# Patient Record
Sex: Male | Born: 1990 | Race: White | Hispanic: No | Marital: Single | Smoking: Current every day smoker
Health system: Southern US, Community
[De-identification: ages and names within clinical notes are randomized; demographics above are authoritative.]

## PROBLEM LIST (undated history)

## (undated) DIAGNOSIS — M519 Unspecified thoracic, thoracolumbar and lumbosacral intervertebral disc disorder: Secondary | ICD-10-CM

## (undated) DIAGNOSIS — K2970 Gastritis, unspecified, without bleeding: Secondary | ICD-10-CM

## (undated) DIAGNOSIS — B9681 Helicobacter pylori [H. pylori] as the cause of diseases classified elsewhere: Secondary | ICD-10-CM

## (undated) DIAGNOSIS — K529 Noninfective gastroenteritis and colitis, unspecified: Secondary | ICD-10-CM

## (undated) DIAGNOSIS — K2990 Gastroduodenitis, unspecified, without bleeding: Secondary | ICD-10-CM

---
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
## 2012-08-26 ENCOUNTER — Ambulatory Visit (INDEPENDENT_AMBULATORY_CARE_PROVIDER_SITE_OTHER): Payer: 59 | Admitting: Family Medicine

## 2012-08-26 ENCOUNTER — Encounter: Payer: Self-pay | Admitting: Family Medicine

## 2012-08-26 VITALS — BP 109/74 | HR 67 | Temp 98.0°F | Ht 66.5 in | Wt 141.2 lb

## 2012-08-26 DIAGNOSIS — Z720 Tobacco use: Secondary | ICD-10-CM

## 2012-08-26 DIAGNOSIS — Z0000 Encounter for general adult medical examination without abnormal findings: Secondary | ICD-10-CM

## 2012-08-26 LAB — POCT CBC
Granulocyte percent: 52 %G (ref 37–80)
HCT, POC: 44.1 % (ref 43.5–53.7)
Hemoglobin: 15.6 g/dL (ref 14.1–18.1)
Lymph, poc: 2.1 (ref 0.6–3.4)
MCH, POC: 30.7 pg (ref 27–31.2)
MCHC: 35.5 g/dL — AB (ref 31.8–35.4)
MCV: 86.5 fL (ref 80–97)
MPV: 7.3 fL (ref 0–99.8)
POC Granulocyte: 2.7 (ref 2–6.9)
POC LYMPH PERCENT: 40.9 %L (ref 10–50)
Platelet Count, POC: 228 10*3/uL (ref 142–424)
RBC: 5.1 M/uL (ref 4.69–6.13)
RDW, POC: 13.6 %
WBC: 5.2 10*3/uL (ref 4.6–10.2)

## 2012-08-26 LAB — LIPID PANEL
Cholesterol: 134 mg/dL (ref 0–200)
HDL: 31 mg/dL — ABNORMAL LOW (ref >39)
LDL Cholesterol: 84 mg/dL (ref 0–99)
Total CHOL/HDL Ratio: 4.3 Ratio
Triglycerides: 97 mg/dL (ref <150)
VLDL: 19 mg/dL (ref 0–40)

## 2012-08-26 LAB — TSH: TSH: 1.602 u[IU]/mL (ref 0.350–4.500)

## 2012-08-26 NOTE — Progress Notes (Signed)
  Subjective:    Patient ID: Mario Moon, male    DOB: Apr 18, 1990, 22 y.o.   MRN: [id number]  HPI This 22 y.o. male presents for evaluation of complete physical exam.  He is a smoker  and smokes 1/2 ppd.  He states he is trying to quit smoking.  He states he drinks sodas About 5 or 6 a day.  He denies ETOH use.  He denies any other health concerns.  He  Is divorced and has a fiance and is planning on getting married.     Review of Systems No chest pain, SOB, HA, dizziness, vision change, N/V, diarrhea, constipation, dysuria, urinary urgency or frequency, myalgias, arthralgias or rash.     Objective:   Physical Exam Vital signs noted  Well developed well nourished male.  HEENT - Head atraumatic Normocephalic                Eyes - PERRLA, Conjuctiva - clear Sclera- Clear EOMI                Ears - EAC's Wnl TM's Wnl Gross Hearing WNL                Nose - Nares patent                 Throat - oropharanx wnl Respiratory - Lungs CTA bilateral Cardiac - RRR S1 and S2 without murmur GI - Abdomen soft Nontender and bowel sounds active x 4 Extremities - No edema. Neuro - Grossly intact.       Assessment & Plan:  Routine general medical examination at a health care facility - Plan: POCT CBC, TSH, Lipid panel, COMPLETE METABOLIC PANEL WITH GFR

## 2012-08-27 LAB — COMPLETE METABOLIC PANEL WITH GFR
ALT: 16 U/L (ref 0–53)
AST: 14 U/L (ref 0–37)
Albumin: 4.3 g/dL (ref 3.5–5.2)
Alkaline Phosphatase: 44 U/L (ref 39–117)
BUN: 19 mg/dL (ref 6–23)
CO2: 25 mEq/L (ref 19–32)
Calcium: 9.2 mg/dL (ref 8.4–10.5)
Chloride: 101 mEq/L (ref 96–112)
Creat: 0.89 mg/dL (ref 0.50–1.35)
GFR, Est African American: >89 mL/min
GFR, Est Non African American: >89 mL/min
Glucose, Bld: 76 mg/dL (ref 70–99)
Potassium: 4.3 mEq/L (ref 3.5–5.3)
Sodium: 137 mEq/L (ref 135–145)
Total Bilirubin: 0.3 mg/dL (ref 0.3–1.2)
Total Protein: 6.8 g/dL (ref 6.0–8.3)

## 2012-08-27 NOTE — Patient Instructions (Signed)
Smoking Cessation Quitting smoking is important to your health and has many advantages. However, it is not always easy to quit since nicotine is a very addictive drug. Often times, people try 3 times or more before being able to quit. This document explains the best ways for you to prepare to quit smoking. Quitting takes hard work and a lot of effort, but you can do it. ADVANTAGES OF QUITTING SMOKING  You will live longer, feel better, and live better.  Your body will feel the impact of quitting smoking almost immediately.  Within 20 minutes, blood pressure decreases. Your pulse returns to its normal level.  After 8 hours, carbon monoxide levels in the blood return to normal. Your oxygen level increases.  After 24 hours, the chance of having a heart attack starts to decrease. Your breath, hair, and body stop smelling like smoke.  After 48 hours, damaged nerve endings begin to recover. Your sense of taste and smell improve.  After 72 hours, the body is virtually free of nicotine. Your bronchial tubes relax and breathing becomes easier.  After 2 to 12 weeks, lungs can hold more air. Exercise becomes easier and circulation improves.  The risk of having a heart attack, stroke, cancer, or lung disease is greatly reduced.  After 1 year, the risk of coronary heart disease is cut in half.  After 5 years, the risk of stroke falls to the same as a nonsmoker.  After 10 years, the risk of lung cancer is cut in half and the risk of other cancers decreases significantly.  After 15 years, the risk of coronary heart disease drops, usually to the level of a nonsmoker.  If you are pregnant, quitting smoking will improve your chances of having a healthy baby.  The people you live with, especially any children, will be healthier.  You will have extra money to spend on things other than cigarettes. QUESTIONS TO THINK ABOUT BEFORE ATTEMPTING TO QUIT You may want to talk about your answers with your  caregiver.  Why do you want to quit?  If you tried to quit in the past, what helped and what did not?  What will be the most difficult situations for you after you quit? How will you plan to handle them?  Who can help you through the tough times? Your family? Friends? A caregiver?  What pleasures do you get from smoking? What ways can you still get pleasure if you quit? Here are some questions to ask your caregiver:  How can you help me to be successful at quitting?  What medicine do you think would be best for me and how should I take it?  What should I do if I need more help?  What is smoking withdrawal like? How can I get information on withdrawal? GET READY  Set a quit date.  Change your environment by getting rid of all cigarettes, ashtrays, matches, and lighters in your home, car, or work. Do not let people smoke in your home.  Review your past attempts to quit. Think about what worked and what did not. GET SUPPORT AND ENCOURAGEMENT You have a better chance of being successful if you have help. You can get support in many ways.  Tell your family, friends, and co-workers that you are going to quit and need their support. Ask them not to smoke around you.  Get individual, group, or telephone counseling and support. Programs are available at local hospitals and health centers. Call your local health department for   information about programs in your area.  Spiritual beliefs and practices may help some smokers quit.  Download a "quit meter" on your computer to keep track of quit statistics, such as how long you have gone without smoking, cigarettes not smoked, and money saved.  Get a self-help book about quitting smoking and staying off of tobacco. LEARN NEW SKILLS AND BEHAVIORS  Distract yourself from urges to smoke. Talk to someone, go for a walk, or occupy your time with a task.  Change your normal routine. Take a different route to work. Drink tea instead of coffee.  Eat breakfast in a different place.  Reduce your stress. Take a hot bath, exercise, or read a book.  Plan something enjoyable to do every day. Reward yourself for not smoking.  Explore interactive web-based programs that specialize in helping you quit. GET MEDICINE AND USE IT CORRECTLY Medicines can help you stop smoking and decrease the urge to smoke. Combining medicine with the above behavioral methods and support can greatly increase your chances of successfully quitting smoking.  Nicotine replacement therapy helps deliver nicotine to your body without the negative effects and risks of smoking. Nicotine replacement therapy includes nicotine gum, lozenges, inhalers, nasal sprays, and skin patches. Some may be available over-the-counter and others require a prescription.  Antidepressant medicine helps people abstain from smoking, but how this works is unknown. This medicine is available by prescription.  Nicotinic receptor partial agonist medicine simulates the effect of nicotine in your brain. This medicine is available by prescription. Ask your caregiver for advice about which medicines to use and how to use them based on your health history. Your caregiver will tell you what side effects to look out for if you choose to be on a medicine or therapy. Carefully read the information on the package. Do not use any other product containing nicotine while using a nicotine replacement product.  RELAPSE OR DIFFICULT SITUATIONS Most relapses occur within the first 3 months after quitting. Do not be discouraged if you start smoking again. Remember, most people try several times before finally quitting. You may have symptoms of withdrawal because your body is used to nicotine. You may crave cigarettes, be irritable, feel very hungry, cough often, get headaches, or have difficulty concentrating. The withdrawal symptoms are only temporary. They are strongest when you first quit, but they will go away within  10 14 days. To reduce the chances of relapse, try to:  Avoid drinking alcohol. Drinking lowers your chances of successfully quitting.  Reduce the amount of caffeine you consume. Once you quit smoking, the amount of caffeine in your body increases and can give you symptoms, such as a rapid heartbeat, sweating, and anxiety.  Avoid smokers because they can make you want to smoke.  Do not let weight gain distract you. Many smokers will gain weight when they quit, usually less than 10 pounds. Eat a healthy diet and stay active. You can always lose the weight gained after you quit.  Find ways to improve your mood other than smoking. FOR MORE INFORMATION  www.smokefree.gov  Document Released: 01/22/2001 Document Revised: 07/30/2011 Document Reviewed: 05/09/2011 [employer] Patient Information 2014 [employer].  

## 2014-07-15 ENCOUNTER — Emergency Department (HOSPITAL_BASED_OUTPATIENT_CLINIC_OR_DEPARTMENT_OTHER)
Admission: EM | Admit: 2014-07-15 | Discharge: 2014-07-15 | Disposition: A | Discharge status: Home / Self Care | Payer: Self-pay | Attending: Emergency Medicine | Admitting: Emergency Medicine

## 2014-07-15 ENCOUNTER — Encounter (HOSPITAL_BASED_OUTPATIENT_CLINIC_OR_DEPARTMENT_OTHER): Payer: Self-pay | Admitting: *Deleted

## 2014-07-15 DIAGNOSIS — K029 Dental caries, unspecified: Secondary | ICD-10-CM | POA: Insufficient documentation

## 2014-07-15 DIAGNOSIS — Z720 Tobacco use: Secondary | ICD-10-CM | POA: Insufficient documentation

## 2014-07-15 DIAGNOSIS — K002 Abnormalities of size and form of teeth: Secondary | ICD-10-CM | POA: Insufficient documentation

## 2014-07-15 MED ORDER — TRAMADOL HCL 50 MG PO TABS: 50.0000 mg | ORAL_TABLET | Freq: Four times a day (QID) | ORAL | Status: AC | PRN

## 2014-07-15 MED ORDER — PENICILLIN V POTASSIUM 500 MG PO TABS: 500.0000 mg | ORAL_TABLET | Freq: Four times a day (QID) | ORAL | Status: AC

## 2014-07-15 MED ORDER — IBUPROFEN 800 MG PO TABS: 800.0000 mg | ORAL_TABLET | Freq: Three times a day (TID) | ORAL | Status: AC | PRN

## 2014-07-15 NOTE — ED Notes (Signed)
Dental pain. 

## 2014-07-15 NOTE — ED Provider Notes (Signed)
CSN: [id number]     Arrival date & time 07/15/14  1809 History   First MD Initiated Contact with Patient 07/15/14 1818     Chief Complaint  Patient presents with  . Dental Pain     (Consider location/radiation/quality/duration/timing/severity/associated sxs/prior Treatment) HPI Patient presents to the emergency department with dental pain that started several days ago.  The patient states that he has chronic dental pain due to severely decaying teeth.  Patient states he has not had any nausea, vomiting, weakness, dizziness, headache, blurred vision, fever or syncope.  The patient states that he did not take any medications prior to arrival History reviewed. No pertinent past medical history. History reviewed. No pertinent past surgical history. No family history on file. History  Substance Use Topics  . Smoking status: Current Every Day Smoker -- 0.50 packs/day    Types: Cigarettes  . Smokeless tobacco: Not on file  . Alcohol Use: No    Review of Systems All other systems negative except as documented in the HPI. All pertinent positives and negatives as reviewed in the HPI.   Allergies  Hydrocodone  Home Medications   Prior to Admission medications   Medication Sig Start Date End Date Taking? Authorizing Provider  traMADol (ULTRAM) 50 MG tablet Take 50 mg by mouth every 6 (six) hours as needed for pain.    Historical Provider, MD   BP 121/66 mmHg  Pulse 86  Temp(Src) 98.9 F (37.2 C) (Oral)  Resp 18  Ht 5\' 7"  (1.702 m)  Wt 150 lb (68.04 kg)  BMI 23.49 kg/m2  SpO2 99% Physical Exam  Constitutional: He is oriented to person, place, and time. He appears well-developed and well-nourished. No distress.  HENT:  Head: Normocephalic and atraumatic.  Mouth/Throat: Uvula is midline and oropharynx is clear and moist. No oral lesions. No trismus in the jaw. Abnormal dentition. Dental caries present. No dental abscesses or uvula swelling.    Eyes: Pupils are equal, round, and  reactive to light.  Neck: Normal range of motion. Neck supple.  Cardiovascular: Normal rate, regular rhythm and normal heart sounds.  Exam reveals no gallop and no friction rub.   No murmur heard. Pulmonary/Chest: Effort normal and breath sounds normal. No respiratory distress.  Neurological: He is alert and oriented to person, place, and time. He exhibits normal muscle tone. Coordination normal.  Skin: Skin is warm and dry. No rash noted. No erythema.  Nursing note and vitals reviewed.   ED Course  Procedures (including critical care time) He is given dental follow-up.  Told to return here as needed.  Patient agrees the plan and all questions were answered   Charlestine NightChristopher Aleshka Corney, PA-C 07/19/14 2355  Geoffery Lyonsouglas Delo, MD 07/20/14 [phone]

## 2014-07-15 NOTE — Discharge Instructions (Signed)
Return here as needed.  Follow-up with the oral surgeon provided.  Rinse with warm water, peroxide 3 times a day

## 2017-12-12 ENCOUNTER — Encounter: Attending: Physician Assistant | Primary: Physician Assistant

## 2017-12-22 ENCOUNTER — Ambulatory Visit
Admit: 2017-12-22 | Discharge: 2017-12-22 | Payer: PRIVATE HEALTH INSURANCE | Attending: Physician Assistant | Primary: Physician Assistant

## 2017-12-22 DIAGNOSIS — G43809 Other migraine, not intractable, without status migrainosus: Secondary | ICD-10-CM

## 2017-12-22 MED ORDER — TIZANIDINE HCL 4 MG PO TABS
4 MG | ORAL_TABLET | Freq: Three times a day (TID) | ORAL | 2 refills | Status: DC | PRN
Start: 2017-12-22 — End: 2018-02-26

## 2017-12-22 MED ORDER — IBUPROFEN 800 MG PO TABS
800 MG | ORAL_TABLET | Freq: Four times a day (QID) | ORAL | 2 refills | Status: DC | PRN
Start: 2017-12-22 — End: 2018-08-11

## 2017-12-22 NOTE — Progress Notes (Signed)
[facility]  [address]  [place], [place] [address]   [phone]      Todd Reyes  1990-11-23  12/22/17      HPI:  Patient presents for establishment with a longstanding history of migraine, anxiety, adhd and lumbar back pain. He is going to a chiropractor who has done 2 mris, showing lumbar disc disease. He has no radicular sx right now. He has had pain radiating to his right posterior leg in the past. His chiropractor suggests that he start PT. He takes motrin 800 mg for this and it does help. He rates his pain 3/10 right now, and feels mostly "tightness." he has not been on a muscle relaxer. He has seen psychiatry for a dx of ADHD and anxiety since childhood. He has also smoked since childhood. He is not ready to quit.     History reviewed. No pertinent past medical history.     History reviewed. No pertinent surgical history.    Current Outpatient Medications   Medication Sig Dispense Refill   . doxepin (SINEQUAN) 100 MG capsule Take 100 mg by mouth nightly     . amphetamine-dextroamphetamine (ADDERALL XR) 15 MG extended release capsule Take 15 mg by mouth 2 times daily.     Marland Kitchen tiZANidine (ZANAFLEX) 4 MG tablet Take 1 tablet by mouth 3 times daily as needed (muscle spasm) 30 tablet 2   . ibuprofen (ADVIL;MOTRIN) 800 MG tablet Take 1 tablet by mouth every 6 hours as needed for Pain 120 tablet 2     No current facility-administered medications for this visit.        No Known Allergies    History reviewed. No pertinent family history.    Social History     Socioeconomic History   . Marital status: Divorced     Spouse name: Not on file   . Number of children: Not on file   . Years of education: Not on file   . Highest education level: Not on file   Occupational History   . Not on file   Social Needs   . Financial resource strain: Not on file   . Food insecurity:     Worry: Not on file     Inability: Not on file   . Transportation needs:     Medical: Not on file     Non-medical: Not on file   Tobacco  Use   . Smoking status: Current Every Day Smoker     Packs/day: 0.50     Years: 23.00     Pack years: 11.50     Types: Cigarettes   . Smokeless tobacco: Former Estate agent and Sexual Activity   . Alcohol use: Not Currently   . Drug use: Not Currently   . Sexual activity: Yes     Partners: Female   Lifestyle   . Physical activity:     Days per week: Not on file     Minutes per session: Not on file   . Stress: Not on file   Relationships   . Social connections:     Talks on phone: Not on file     Gets together: Not on file     Attends religious service: Not on file     Active member of club or organization: Not on file     Attends meetings of clubs or organizations: Not on file     Relationship status: Not on file   . Intimate partner  violence:     Fear of current or ex partner: Not on file     Emotionally abused: Not on file     Physically abused: Not on file     Forced sexual activity: Not on file   Other Topics Concern   . Not on file   Social History Narrative   . Not on file       Review of Systems :  Constitutional: negative for - chills, fever, weight loss, or fatigue  Psychological: negative for - anxiety, depression or suicidal ideation  HEENT: negative for - vision changes, nasal congestion, ear pain or pharyngitis   Respiratory: negative for -  Chest heaviness, cough, shortness of breath, or pleuritic pain  Cardiovascular: negative for - diaphoresis, chest pain, palpitations, or edema   Gastrointestinal: negative for -abdominal pain, change in bowel habits, constipation, diarrhea, or nausea/vomiting  Genito-Urinary: negative for - dysuria, frequency, or nocturia  Neurological: negative for - bowel and bladder control changes, dizziness, or headaches   Dermatological: negative for - dry skin, rash, hair or nail symptoms    Physical Exam:   Vitals:    12/22/17 0814   BP: 118/62   Site: Right Upper Arm   Position: Sitting   Cuff Size: Large Adult   Pulse: 95   Resp: 20   Temp: 97.8 F (36.6 C)   TempSrc:  Oral   SpO2: 99%   Weight: 160 lb (72.6 kg)   Height: 5\' 6"  (1.676 m)       Physical Exam  Constitutional:       General: He is not in acute distress.     Appearance: He is well-developed.   HENT:      Head: Normocephalic and atraumatic.      Right Ear: External ear normal.      Left Ear: External ear normal.      Nose: Nose normal.   Eyes:      General: No scleral icterus.     Conjunctiva/sclera: Conjunctivae normal.      Pupils: Pupils are equal, round, and reactive to light.   Neck:      Musculoskeletal: Normal range of motion and neck supple.      Thyroid: No thyromegaly.   Cardiovascular:      Rate and Rhythm: Normal rate and regular rhythm.      Heart sounds: Normal heart sounds. No murmur.   Pulmonary:      Effort: Pulmonary effort is normal. No accessory muscle usage or respiratory distress.      Breath sounds: Normal breath sounds. No wheezing.   Musculoskeletal: Normal range of motion.         General: Tenderness (left low lumbar spasm) present.   Skin:     General: Skin is warm and dry.      Findings: No rash.   Neurological:      Mental Status: He is alert and oriented to person, place, and time.      Deep Tendon Reflexes: Reflexes are normal and symmetric.   Psychiatric:         Speech: Speech normal.         Behavior: Behavior normal.         Assessment/Plan:     Todd Reyes was seen today for establish care, back pain and migraine.    Diagnoses and all orders for this visit:    Other migraine without status migrainosus, not intractable    Anxiety    Smoker  Attention deficit hyperactivity disorder (ADHD), unspecified ADHD type    Lumbar disc disease  -     Amb External Referral To Physical Therapy  -     tiZANidine (ZANAFLEX) 4 MG tablet; Take 1 tablet by mouth 3 times daily as needed (muscle spasm)  -     ibuprofen (ADVIL;MOTRIN) 800 MG tablet; Take 1 tablet by mouth every 6 hours as needed for Pain        Return in about 6 weeks (around 02/02/2018). signed record release for chiropractor and previous  PCP.  Refused Fluzone.     Florinda Marker, PA

## 2018-01-19 DIAGNOSIS — Z202 Contact with and (suspected) exposure to infections with a predominantly sexual mode of transmission: Secondary | ICD-10-CM

## 2018-01-19 NOTE — ED Notes (Signed)
Discharge instructions given. Patient verbalizes understanding. No other noted or stated problems at this time. Patient will follow up with primary care.     Lavetta Nielsenarissa M Joangel Vanosdol, RN  01/19/18 2230

## 2018-01-19 NOTE — ED Provider Notes (Signed)
[facility]  ??  Department of Emergency Medicine   ED  Provider Note  Admit Date/RoomTime: 01/19/2018  9:29 PM  ED Room: 35/35  Chief Complaint   Exposure to STD    History of Present Illness   Source of history provided by:  patient.  History/Exam Limitations: none.     Todd Reyes is a 27 y.o. old male who has a past medical history of: No past medical history on file. presents to the emergency department by private vehicle, for known exposure to STD with dysuria, which occured 1 week(s) prior to arrival.  Pt states his girlfriend test + for trichomonas and it was recommended he was treated. All of her other results were negative per girlfriend. Since exposure/onset the symptoms have been intermittent and mild in severity. He denies any genital discharge, genital rash, genital ulcers, scrotal mass or scrotal pain. His prior STD history: none. Denies cp, sob, abdominal pain, nvd, melena, bloody stool.     ROS    Pertinent positives and negatives are stated within HPI, all other systems reviewed and are negative.    Past Surgical History:  has no past surgical history on file.  Social History:  reports that he has been smoking cigarettes. He has a 11.50 pack-year smoking history. He has quit using smokeless tobacco. He reports previous alcohol use. He reports previous drug use.  Family History: family history is not on file.  Allergies: Patient has no known allergies.    Physical Exam         ED Triage Vitals   BP Temp Temp src Pulse Resp SpO2 Height Weight   -- -- -- -- -- -- -- --      Oxygen Saturation Interpretation: Normal.    ?? Constitutional:  Alert, development consistent with age.  ?? HEENT:  NC/NT.  Airway patent  ?? Neck:  Normal ROM.  Supple.  ?? Respiratory:  Lungs Clear to auscultation and breath sounds equal.  ?? CV:  Regular rate and rhythm, normal heart sounds, without pathological murmurs, ectopy, gallops, or rubs.  ?? GI:  Abdomen Soft, nontender, good bowel sounds.  No firm or pulsatile  mass.  ?? GU:          Penis: .            Scrotum: normal.           Testicle: normal without masses bilateral.  ?? Integument:  Normal turgor.  Warm, dry, without visible rash, unless noted elsewhere.  Lymphatics: No lymphangitis or adenopathy noted.  ?? Neurological:  Orientation age-appropriate.  Motor functions intact.    Lab / Imaging Results   (All laboratory and radiology results have been personally reviewed by myself)  Labs:  Results for orders placed or performed during the hospital encounter of 01/19/18   C.trachomatis N.gonorrhoeae DNA, Urine   Result Value Ref Range    Source Urine      Imaging:  All Radiology results interpreted by Radiologist unless otherwise noted.  No orders to display     ED Course / Medical Decision Making     Medications   azithromycin (ZITHROMAX) tablet 1,000 mg (has no administration in time range)   cefTRIAXone (ROCEPHIN) injection 250 mg (has no administration in time range)   metroNIDAZOLE (FLAGYL) tablet 2,000 mg (has no administration in time range)   lidocaine PF 1 % injection (has no administration in time range)        Consult(s):   None  Procedure(s):   none    Medical Decision Making: Patient exposed to trichomonas per partner. Patient to check my chart for lab results, and he is agreeable with this plan. Pt treated for STI exposure- counseled on abstaining from intercourse for 1 week. Pt educated on safe sex practices. Pt to f/u with PCP for further evaluation. Pt to return to ED with new or worsening symptoms.     Counseling:    The emergency provider has spoken with the patient and discussed today???s results, in addition to providing specific details for the plan of care and counseling regarding the diagnosis and prognosis.  Questions are answered at this time and they are agreeable with the plan.    Assessment     1. Concern about STD in male without diagnosis      Plan   Discharge to home  Patient condition is stable    New Medications     New Prescriptions    No  medications on file     Electronically signed by Burnett Kanaris, PA-C   DD: 01/19/18  **This report was transcribed using voice recognition software. Every effort was made to ensure accuracy; however, inadvertent computerized transcription errors may be present.  END OF ED PROVIDER NOTE     Burnett Kanaris, PA-C  01/19/18 2205

## 2018-01-20 ENCOUNTER — Inpatient Hospital Stay
Admit: 2018-01-20 | Discharge: 2018-01-20 | Disposition: A | Discharge status: Home / Self Care | Payer: PRIVATE HEALTH INSURANCE

## 2018-01-20 LAB — URINALYSIS
Bilirubin Urine: NEGATIVE
Blood, Urine: NEGATIVE
Glucose, Ur: NEGATIVE mg/dL
Ketones, Urine: NEGATIVE mg/dL
Leukocyte Esterase, Urine: NEGATIVE
Nitrite, Urine: NEGATIVE
Protein, UA: NEGATIVE mg/dL
Specific Gravity, UA: 1.025 (ref 1.005–1.030)
Urobilinogen, Urine: 1 E.U./dL (ref <2.0)
pH, UA: 7 (ref 5.0–9.0)

## 2018-01-20 MED ORDER — LIDOCAINE HCL (PF) 1 % IJ SOLN
1 % | INTRAMUSCULAR | Status: AC
Start: 2018-01-20 — End: 2018-01-19
  Administered 2018-01-20: 03:00:00 2

## 2018-01-20 MED ORDER — AZITHROMYCIN 250 MG PO TABS
250 MG | Freq: Once | ORAL | Status: AC
Start: 2018-01-20 — End: 2018-01-19
  Administered 2018-01-20: 03:00:00 1000 mg via ORAL

## 2018-01-20 MED ORDER — METRONIDAZOLE 500 MG PO TABS
500 MG | Freq: Once | ORAL | Status: AC
Start: 2018-01-20 — End: 2018-01-19
  Administered 2018-01-20: 03:00:00 2000 mg via ORAL

## 2018-01-20 MED ORDER — CEFTRIAXONE SODIUM 250 MG IJ SOLR
250 MG | Freq: Once | INTRAMUSCULAR | Status: AC
Start: 2018-01-20 — End: 2018-01-19
  Administered 2018-01-20: 03:00:00 250 mg via INTRAMUSCULAR

## 2018-01-20 MED FILL — CEFTRIAXONE SODIUM 250 MG IJ SOLR: 250 mg | INTRAMUSCULAR | Qty: 250

## 2018-01-20 MED FILL — METRONIDAZOLE 500 MG PO TABS: 500 mg | ORAL | Qty: 4

## 2018-01-20 MED FILL — LIDOCAINE HCL (PF) 1 % IJ SOLN: 1 % | INTRAMUSCULAR | Qty: 2

## 2018-01-20 MED FILL — AZITHROMYCIN 250 MG PO TABS: 250 mg | ORAL | Qty: 4

## 2018-01-23 LAB — C.TRACHOMATIS N.GONORRHOEAE DNA, URINE
C. trachomatis DNA ,Urine: NEGATIVE
N. gonorrhoeae DNA, Urine: NEGATIVE

## 2018-02-09 ENCOUNTER — Encounter: Payer: PRIVATE HEALTH INSURANCE | Attending: Physician Assistant | Primary: Physician Assistant

## 2018-02-26 ENCOUNTER — Ambulatory Visit
Admit: 2018-02-26 | Discharge: 2018-02-26 | Payer: PRIVATE HEALTH INSURANCE | Attending: Surgery | Primary: Physician Assistant

## 2018-02-26 DIAGNOSIS — K922 Gastrointestinal hemorrhage, unspecified: Secondary | ICD-10-CM

## 2018-02-26 MED ORDER — BISACODYL 5 MG PO TBEC
5 MG | ORAL_TABLET | ORAL | 0 refills | Status: DC
Start: 2018-02-26 — End: 2018-03-16

## 2018-02-26 MED ORDER — MAGNESIUM CITRATE PO SOLN
Freq: Three times a day (TID) | ORAL | 0 refills | Status: DC
Start: 2018-02-26 — End: 2018-03-16

## 2018-02-26 NOTE — Progress Notes (Signed)
Scheduled pt for Colonoscopy on 03/16/18 at 9:00 AM. Pt needs to arrive at [facility] : [facility]. [place] at 8:00 AM. Sent instruction sheet.  Electronically signed by Barbaraann Faster on 02/26/18 at 4:21 PM
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
## 2018-02-26 NOTE — Patient Instructions (Signed)
Dr. Tawanna Funk recommended colonoscopy with possible biopsy or polypectomy and he explained the risk, benefits, expected outcome, and alternatives to the procedure.  Risks included but are not limited to bleeding, infection, respiratory distress, hypotension, and perforation of the colon.  You understood and were in agreement. You will need to have someone bring you to the hospital and take you home because you will not be able to drive or work the rest of that day.  Also, you need to have someone stay with you the rest of the day to make sure you do not develop any complications.     [facility]  MAGNESIUM CITRATE/DULCOLAX TABLETS  COLON PREP FOR COLONOSCOPY OR COLON SURGERY    It is very important that you follow all of the instructions listed on this sheet carefully (they may be slightly different than the directions on the product that you purchase at the pharmacy) to ensure that your colon is adequately cleaned out or your risk of complications could be increased.    2 Days or More Before Endoscopy:  ??? Obtain three 10-ounce bottle of Magnesium Citrate and 1 bottle of Dulcolax tablets from the pharmacy.  ??? Do not eat corn, tomatoes, peas or watermelon 5 days before procedure.  ??? If you are on INSULIN or OTHER DIABETIC MEDICATIONS, then check with your primary care physician as to how to adjust your medication while on clear liquid diet and when nothing by mouth.     1 Day Before the Endoscopy:  ??? No solid food - only clear liquids (soup, jello, or juice that you can see through with no solid food) for breakfast, lunch and supper.  DO NOT drink or eat anything that is red as it will turn the inside of the colon red and look like blood.    ??? Have at least 8 oz or more of clear liquids for breakfast (7 am to 8 am) and lunch (11:30 am to 12:30 pm).  ??? 12 Noon Drink a 10 oz bottle of Magnesium Citrate and 4 Dulcolax tablets followed immediately by at least 8 oz of clear liquids.  ??? 1:00 pm  Drink at least 8 oz of clear liquids.  ??? 2:00 pm Take 4 Dulcolax tablets and drink at least 8 oz of clear liquids.  ??? 3:00 pm Drink at least 8 oz of clear liquids.  ??? 4:00 pm Drink a 10 oz bottle of Magnesium Citrate followed immediately by at least 8 oz of clear liquids.  ??? 5:00 pm Drink at least 8 oz of clear liquids.  ??? Can continue to take liquids until 12 midnight then nothing to eat or drink except as instructed below    Day of Endoscopy:  ??? 4 hours prior to scheduled time for colonoscopy, drink a 10 oz bottle of Magnesium Citrate followed immediately by at least 8 oz of clear liquids.  Then nothing to drink after that.  ??? If any blood pressure medications or heart medications are due in the morning, you should take them with a sip of water.    Patient Information and Instructions for Colonoscopy         Definition of Colonoscopy   A colonoscopy is the visual exam of the rectum and colon (large intestine). The exam is done with a tool called a colonoscope. The colonoscope is a flexible tube with a tiny camera on the end. This instrument allows the doctor to view the inside of your rectum and colon.  Sigmoidoscopy   is a shorter scope that views only the last one third of the colon.     Reasons for Colonoscopy   It is used to examine, diagnose, and treat problems in your large intestine. The procedure is most often done for the following reasons:   To determine the cause of abdominal pain, rectal bleeding, or a change in bowel habits   To detect and treat colon cancer or colon polyps   To obtain tissue samples for testing   To stop intestinal bleeding   Monitor response to treatment if you have inflammatory bowel disease     Possible Complications   Complications are rare, but no procedure is completely free of risk. If you are planning to have a colonoscopy, your doctor will review a list of possible complications, which may include:   Bleeding   Reaction to the sedation causing drop in your blood pressure or  problems breathing  Perforation or puncture of the bowel     Factors that may increase the risk of complications include:   Pre-existing heart or kidney condition   Treatment with certain medicines, including aspirin and other drugs with anticoagulant or blood-thinning properties   Prior abdominal surgery or radiation treatments   Active colitis , diverticulitis , or other acute bowel disease   Previous treatment with radiation therapy     Be sure to discuss these risks with your doctor before the procedure.     What to Expect   Prior to Procedure   Your doctor will likely do the following:   Physical exam   Health history   Review of medicines   Test your stool for hidden blood (called "occult blood")     Your colon must be completely clean before the procedure. Any stool left in the intestine will block the view. This preparation may start several days before the procedure. Follow your doctor's instructions.    Leading up to your procedure:   Talk to your doctor about your medicines. You may be asked to stop taking some medicines up to one week before the procedure, like:   Anti-inflammatory drugs (e.g., aspirin )   Blood thinners like clopidogrel (Plavix) or warfarin (Coumadin)   Iron supplements or vitamins containing iron   The day or days before your procedure, go on a clear liquid diet (clear broth, clear juice, clear jello) with no red coloring  Do not eat or drink anything after midnight.   Wear comfortable clothing.   If you have diabetes, ask your doctor if you need to adjust your diabetes medicine on the day prior to your procedure and the day of your procedure.   Arrange for a ride home after the procedure.     Anesthesia   You will receive intravenous sedation medicine for the procedure so you will not feel anything during the procedure.     Description of the Procedure   You will lie on your left side with knees bent and drawn up toward your chest. The colonoscope will be slowly inserted through the  rectum and into the bowel. The colonoscope will inject air into the colon. A small attached video camera will allow the doctor to view the colon's lining on a screen. The doctor will continue guiding the tool through the bowel and assess the lining. A tissue sample or polyps may be removed during the procedure.     How Long Will It Take?   Usually it takes about 30 to 45 minutes       Will It Hurt?   Most people do not feel anything during the procedure and will not remember the procedure.      After the procedure, gas pains and cramping are common. These pains should go away with the passing of gas.     Post-procedure Care   If any tissue was removed:   It will be sent to a lab to be examined. It may take 1-2 weeks for results. The doctor will usually give an initial report after the scope is removed. Other tests may be recommended.   A small amount of bleeding may occur during the first few days after the procedure.     When you return home after the procedure, be sure to follow your doctor's instructions, which may include:   Resume medicines as instructed by your doctor.   Resume normal diet, unless directed otherwise by your doctor.   The sedative will make you drowsy. Avoid driving, operating machinery, or making important decisions for the rest of the day.   Rest for the remainder of the day.     After arriving home, contact your doctor if any of the following occurs:   Bleeding from your rectum, notify your doctor if you pass a teaspoonful of blood or more.   Black, tarry stools   Severe abdominal pain   Hard, swollen abdomen   Signs of infection, including fever or chills   Inability to pass gas or stool   Coughing, shortness of breath, chest pain, severe nausea or vomiting     In case of an emergency, CALL 911 .

## 2018-03-11 NOTE — Progress Notes (Signed)
[facility] PRE-ADMISSION TESTING ENDOSCOPY INSTRUCTIONS- PAT-phone number:[phone]    ENDOSCOPY INSTRUCTIONS:   [x]  Bowel prep instructions reviewed.   [x]  Nothing by mouth after midnight, including gum, candy, mints, or water. Please follow your surgeons instructions if you are required to complete a bowel prep. Colonoscopy- no solid food-only clear liquids the day prior).  [x]  You may brush your teeth, gargle, but do NOT swallow water.   [x]  Do not wear makeup, lotions, powders, deodorant.  Nail polish as directed by the nurse.  [x]  Arrange transportation with a responsible adult companion to and from the hospital. If you do not have a responsible adult companion to transport you, you will need to make arrangements with a medical transportation company (i.e. [employer]. A Uber/taxi/bus is not appropriate unless you are accompanied by a responsible adult companion). Arrange for someone to be with you for the remainder of the day and for 24 hours after your procedure due to having had anesthesia.    Who will be your companion for transportation?____michelle______________   Who will be staying with you for 24 hrs after your procedure?_michelle_________________    PARKING INSTRUCTIONS:   [x]  Arrival Time:_0800_______________________  ?? []  Parking lot  "I" OR 1 is located on [address] (the corner of [address] and [address]).  To enter, press the button and the gate will lift.  A free token will be provided to exit the lot.   One car per patient is allowed to park in this lot. All other cars are to park on [address] either in the parking garage or the handicap lot.          []  To reach the [place] lobby from [address], upon entering the hospital, take elevator B to the 3rd floor.    EDUCATION INSTRUCTIONS:  [x]  Bring a complete list of your medications, please write the last time you took the medicine, give this list to the nurse.  []  Take the following medications the morning of  surgery with 1-2 ounces of water: none  []  Stop herbal supplements and vitamins 5 days before your surgery.  []  DO NOT take any diabetic medicine the morning of surgery.  Follow instructions for insulin the day before surgery.  []  If you are diabetic and your blood sugar is low or you feel symptomatic, you may drink 1-2 ounces of apple juice or take a glucose tablet.  The morning of your procedure, you may call the pre-op area if you have concerns about your blood sugar [phone].  []  Use your inhalers the morning of surgery.  Bring your emergency inhaler with you day of surgery.  [x]  Follow physician instructions regarding any blood thinners you may be taking.hold motrin  WHAT TO EXPECT:  [x]  The day of your procedure you will be greeted and checked in by the [employer].  In addition, you will be registered in the [facility] by a Patient Visual merchandiser. Please bring your photo ID and insurance card. A nurse will greet you in accordance to the time you are needed in the pre-op area to prepare you for surgery. Please do not be discouraged if you are not greeted in the order you arrive as there are many variables that are involved in patient preparation.  Your patience is greatly appreciated as you wait for your nurse.  Please bring in items such as: books, magazines, newspapers, electronics, or any other items  to occupy your time in the waiting area.   []   Delays may occur. Staff will make a sincere effort to keep you informed of delays.  If any delays occur with your procedure, we apologize ahead of time for your inconvenience as we recognize the value of your time.

## 2018-03-16 ENCOUNTER — Inpatient Hospital Stay: Payer: PRIVATE HEALTH INSURANCE

## 2018-03-16 MED ORDER — FENTANYL CITRATE (PF) 100 MCG/2ML IJ SOLN
100 MCG/2ML | INTRAMUSCULAR | Status: DC | PRN
Start: 2018-03-16 — End: 2018-03-16
  Administered 2018-03-16: 14:00:00 50 via INTRAVENOUS

## 2018-03-16 MED ORDER — PROPOFOL 500 MG/50ML IV EMUL
50050 MG/50ML | INTRAVENOUS | Status: DC | PRN
Start: 2018-03-16 — End: 2018-03-16
  Administered 2018-03-16: 14:00:00 20 via INTRAVENOUS
  Administered 2018-03-16: 14:00:00 260 via INTRAVENOUS
  Administered 2018-03-16: 14:00:00 50 via INTRAVENOUS
  Administered 2018-03-16: 14:00:00 30 via INTRAVENOUS

## 2018-03-16 MED ORDER — FENTANYL CITRATE (PF) 100 MCG/2ML IJ SOLN
100 | INTRAMUSCULAR | Status: AC
Start: 2018-03-16 — End: 2018-03-16

## 2018-03-16 MED ORDER — HYDROCORTISONE 2.5 % RE CREA
2.5 % | RECTAL | 6 refills | Status: DC
Start: 2018-03-16 — End: 2019-11-09

## 2018-03-16 MED ORDER — NORMAL SALINE FLUSH 0.9 % IV SOLN
0.9 % | INTRAVENOUS | Status: DC | PRN
Start: 2018-03-16 — End: 2018-03-16

## 2018-03-16 MED ORDER — SODIUM CHLORIDE 0.9 % IV SOLN
0.9 % | INTRAVENOUS | Status: DC | PRN
Start: 2018-03-16 — End: 2018-03-16
  Administered 2018-03-16: 14:00:00 via INTRAVENOUS

## 2018-03-16 MED ORDER — PROPOFOL 500 MG/50ML IV EMUL
500 | INTRAVENOUS | Status: AC
Start: 2018-03-16 — End: 2018-03-16

## 2018-03-16 MED ORDER — LACTATED RINGERS IV SOLN
INTRAVENOUS | Status: DC
Start: 2018-03-16 — End: 2018-03-16
  Administered 2018-03-16: 14:00:00 via INTRAVENOUS

## 2018-03-16 MED ORDER — NORMAL SALINE FLUSH 0.9 % IV SOLN
0.9 % | Freq: Two times a day (BID) | INTRAVENOUS | Status: DC
Start: 2018-03-16 — End: 2018-03-16

## 2018-03-16 MED FILL — FENTANYL CITRATE (PF) 100 MCG/2ML IJ SOLN: 100 MCG/2ML | INTRAMUSCULAR | Qty: 2

## 2018-03-16 MED FILL — PROPOFOL 500 MG/50ML IV EMUL: 500 MG/50ML | INTRAVENOUS | Qty: 50

## 2018-03-16 NOTE — Progress Notes (Signed)
Discharge instructions given.Diet, activity, bathing, incisions, medications and follow up instructions reviewed. Patient and family member verbalized understanding, no other noted or stated problems at this time. Patient will follow up with physician as directed

## 2018-03-16 NOTE — Discharge Instructions (Signed)
Findings of colonoscopy:  1. Lower GI and rectal bleeding, abdominal pain, and anal pain -Dr. Eligah East found no definite source for the bleeding was seen on exam today.  This could be due to intermittent proctitis (irritation of anal area) although no significant proctitis was seen today.  Could also be due to nonspecific colitis (irritation of the colon) although did not seem to be severe enough to account for the bleeding.  Dr. Eligah East ordered Anusol HC cream to anal area as directed, three times a day and after each BM x 2 weeks then as needed.    2. Nonspecific colitis -Dr. Eligah East will check biopsy results and you need to follow-up with him in the office in 6 weeks.

## 2018-03-16 NOTE — Interval H&P Note (Signed)
Update History & Physical    The patient's History and Physical of 1 / 16 / 2020 was reviewed with the patient and I examined the patient.  There were no significant changes from the previous History and Physical.  The lower GI and rectal bleeding has been less but no resolved.  He had some emesis with prep yesterday but got all the prep down and having clear fluid stools now.  Abdominal exam showed mild to moderate right-sided abdominal tenderness with 6 out of 10 in the right upper quadrant 3 out of 10 the right lower quadrant.  There is no guarding, rebound, rigidity.    Plan: The risk, benefits, expected outcome, and alternative to the recommended procedure have been discussed with the patient.  Patient understands and wants to proceed with the procedure.    Electronically signed by Caren Hazy, MD on 03/16/18 at 8:29 AM

## 2018-03-16 NOTE — Op Note (Signed)
PROCEDURE NOTE    DATE OF PROCEDURE: 03/16/2018    SURGEON: Caren Hazy, M.D.    ASSISTANT: None    PREOPERATIVE DIAGNOSIS: Diagnostic colonoscopy for Lower GI and rectal bleeding, abdominal pain, and anal pain    POSTOPERATIVE DIAGNOSIS: Same with mild to moderate nonspecific colitis in the transverse and sigmoid colon and rectum    OPERATION: Total colonoscopy with biopsies of the proximal transverse colon, sigmoid colon at 40 cm anus, and mid rectum    ANESTHESIA: Local monitored anesthesia.     ESTIMATED BLOOD LOSS: less than 50     COMPLICATIONS: None.     SPECIMENS:  Was Obtained: Proximal transverse colon, sigmoid colon, and rectal biopsies    HISTORY: The patient is a 28 y.o. year old male with history of above preop diagnosis.  I recommended colonoscopy with possible biopsy or polypectomy and I explained the risk, benefits, expected outcome, and alternatives to the procedure.  Risks included but are not limited to bleeding, infection, respiratory distress, hypotension, and perforation of the colon.  The patient understands and is in agreement.        PROCEDURE: The patient was given IV conscious sedation per anesthesia. The patient was given supplemental oxygen by nasal cannula.  The colonoscope was inserted per rectum and advanced under direct vision to the cecum without difficulty.  The prep was good so exam was adequate.    FINDINGS:  Cecum/Ascending colon: normal    Transverse colon: abnormal: Mild to moderate nonspecific colitis, biopsies of proximal transverse colon    Descending/Sigmoid colon: abnormal: Diffuse mild to moderate nonspecific colitis, biopsies of sigmoid colon 40 cm from anus    Rectum/Anus: examined in normal and retroflexed positions and was abnormal: Mild to moderate nonspecific proctocolitis with biopsy of the mid rectum, no proctitis or hemorrhoids were seen    The colon was decompressed and the scope was removed.  The withdraw time was approximately 27 minutes.  The patient  tolerated the procedure well.     ASSESSMENT/PLAN:   1. Lower GI and rectal bleeding, abdominal pain, and anal pain - no definite source for the bleeding was seen on exam today.  This could be due to intermittent proctitis although no significant proctitis was seen today.  Could also be due to nonspecific colitis although did not seem to be severe enough to account for the bleeding.  Will empirically order  Anusol HC cream to anal area as directed, three times a day and after each BM x 2 weeks then as needed.    2. Nonspecific colitis - will check biopsy results and have patient follow-up with me in the office in 6 weeks.    Electronically signed by Caren Hazy, MD on 03/16/18 at 9:32 AM

## 2018-03-16 NOTE — Progress Notes (Signed)
Admitted to [facility]. Instructions given to pt and girlfriend. MA Anvi Mangal RN

## 2018-03-16 NOTE — Progress Notes (Signed)
Patient admitted to [facility]. Placed on appropriate monitors.  Assisted with liquids and nutrition. Call light at bedside.Family at bedside.

## 2018-03-16 NOTE — Anesthesia Post-Procedure Evaluation (Signed)
Department of Anesthesiology  Postprocedure Note    Patient: Todd Reyes  MRN: [id number]  Birthdate: 1990-05-30  Date of evaluation: 03/16/2018  Time:  2:33 PM     Procedure Summary     Date:  03/16/18 Room / Location:  [facility] / [facility]    Anesthesia Start:  [phone] Anesthesia Stop:  9169    Procedure:  COLONOSCOPY WITH BIOPSY (N/A ) Diagnosis:  (RECTAL BLEED)    Surgeon:  Zannie Cove, MD Responsible Provider:  Olga Coaster, MD    Anesthesia Type:  MAC ASA Status:  2          Anesthesia Type: MAC    Aldrete Phase I: Aldrete Score: 10    Aldrete Phase II: Aldrete Score: 9    Last vitals: Reviewed and per EMR flowsheets.       Anesthesia Post Evaluation    Patient location during evaluation: PACU  Patient participation: complete - patient participated  Level of consciousness: awake  Airway patency: patent  Nausea & Vomiting: no nausea and no vomiting  Complications: no  Cardiovascular status: hemodynamically stable  Respiratory status: acceptable  Hydration status: euvolemic

## 2018-03-16 NOTE — Other (Signed)
MyChart Note:    I reviewed the biopsies from your colonoscopy on 03/16/2018.  Although the colon appears to be mildly inflamed, the biopsies did not show any significant colitis.  One of the biopsies raised the possibility of a submucosal leiomyoma, which is a non-cancerous tumor in the wall of the colon; however, I did not see any sign of this so I do not feel that you have this.  Continue the directions that I gave you after the colonoscopy and follow up with me in the office in 6 weeks as instructed after the colonoscopy.  Thanks!!    Electronically signed by Caren Hazy, MD on 03/18/18 at 4:28 PM
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
## 2018-04-23 ENCOUNTER — Ambulatory Visit
Admit: 2018-04-23 | Discharge: 2018-04-23 | Payer: PRIVATE HEALTH INSURANCE | Attending: Surgery | Primary: Physician Assistant

## 2018-04-23 DIAGNOSIS — K5909 Other constipation: Secondary | ICD-10-CM

## 2018-04-23 MED ORDER — DOCUSATE SODIUM 100 MG PO CAPS
100 MG | ORAL_CAPSULE | Freq: Two times a day (BID) | ORAL | 6 refills | Status: DC
Start: 2018-04-23 — End: 2018-07-30

## 2018-04-23 NOTE — Patient Instructions (Addendum)
1. Abdominal Pain - possible Irritable Bowel Syndrome (see instructions below)  2. Anorectal Pain and Bleeding - Sitz bath or warm soak in tube for 30 minute followed by application of Anusol HC cream to anal area as directed, three times a day and after each BM  3. Constipation/Hard Stools - start stool softener (Colace or Docusate) 1 tablet by mouth twice a day       Irritable Bowel Syndrome     Irritable bowel syndrome (IBS) is a chronic disorder of the intestines. IBS does not cause inflammation and does not lead to a more serious condition.     There is no cure for IBS. Treatment focuses on managing the symptoms. Common treatment approaches include:    ?? Diet     ?? Stress management     ?? Medication     What You Will Need     Along with your medications, you will need the following:    ?? Food diary     ?? Heating pad     Steps to Take     Home Care     ?? Your doctor may ask you to keep a food diary. Record everything you eat and drink and any symptoms. This can help identify food allergies or intolerances, as well as help plan your treatment.      ?? To relieve discomfort, apply a heating pad to your abdomen.      Diet     Diet is a crucial part of treating IBS. Understanding which foods cause your symptoms and avoiding them is important. Taking these measures may help control your symptoms:    ?? Make gradual changes to your diet. Record the results.     ?? Avoid foods and drinks that may cause symptoms, such as:     ?? High fat foods, spicy foods     ?? Dairy products     ?? Onions, cabbage, and other gas-producing food     ?? Large amounts of alcohol or caffeine     ?? Eat foods that may reduce the chance of spasm, such as:     ?? Fruits and vegetables     ?? Whole grains and other high-fiber foods (More fiber may increase gas and bloating until your body adjusts.)     ?? Eat smaller meals more often or smaller portions.     ?? Eat slowly and try not to swallow air.     ?? Drink plenty of water. This will help to reduce  constipation .     Physical Activity     Include a regular exercise routine into your schedule. Exercise can improve bowel function and other IBS symptoms. Talk to your doctor first to make sure you are healthy enough to exercise.     Medications    You can try the following over the counter medications:    ??   ?? Antiflatulants (eg, simethicone)     ?? High-fiber bulking agents (eg, psyllium [Metamucil])     ?? Antidiarrheal agents (such as lomotile or immodium)     ?? Acetaminophen (eg, Tylenol)     ?? Herbs and supplements (eg, probiotics , peppermint oil ) I     Lifestyle Changes    Some things to keep in mind include:    ?? Learn as much as you can about IBS and ways to reduce your symptoms.     ?? Consider talking to a counselor or joining a support group  for people with IBS.     ?? Stress can trigger IBS. Learn ways to reduce stress . Talk to your doctor about relaxation techniques and biofeedback.     ?? Get enough sleep each night.     Prevention     The cause of IBS is unknown. So, there are no known ways to prevent it. Diet and lifestyle changes may help to manage the symptoms.      Any questions or concerns, please contact my medical assistant Caitlin at [phone].

## 2018-04-23 NOTE — Progress Notes (Signed)
General Surgery Progress Note  Chief Complaint   Patient presents with   ??? Abdominal Pain     Pt complains of frequent abdominal pain on both sides with cramping, change in bowels with both dirrahea and constipation. Denies any nasea and vomiting.    ??? Colonoscopy     Pt had a colonoscopy begining of feburary. States he feels more pain now.      History:  The patient is 28 y.o. male was seen in the office for follow up for 03/16/2018 colonoscopy, which was for lower GI and rectal bleeding, abdominal pain, and anal pain.  It revealed mild to moderate nonspecific colitis in the transverse and sigmoid colon and rectum.  Each area was biopsied.  No etiology for the bleeding was identified.  He was started on Anusol HC anal cream TID and after each BM.    He returned today for 6-week follow up.  He has been using the Anusol HC anal cream but is just now finishing the first tube so he has probably not been using it as frequently or applying sufficient amount of cream.  He continued to have intermittent abdominal pain about 3 times a day lasting from few minutes to 30-45 minutes.  He has not identified any aggravating or relieving factors.  He also still has intermittent rectal bleeding when he wipes.  Last time was a few days ago.  He has been using medicated pads instead of dry toilet paper.  He denied any nausea, vomiting, heartburn, or indigestion.    Prior to Admission medications    Medication Sig Start Date End Date Taking? Authorizing Provider   divalproex (DEPAKOTE) 500 MG DR tablet Take 500 mg by mouth daily   Yes Historical Provider, MD   hydrocortisone (ANUSOL-HC) 2.5 % rectal cream Apply to anal area as directed 3 times a day and after each BM. 03/16/18  Yes Caren Hazy, MD   diazepam (VALIUM) 5 MG tablet Take 5 mg by mouth daily as needed for Anxiety.   Yes Historical Provider, MD   amphetamine-dextroamphetamine (ADDERALL) 20 MG tablet Take 20 mg by mouth 2 times daily.  11/20/17  Yes Historical Provider, MD    ibuprofen (ADVIL;MOTRIN) 800 MG tablet Take 1 tablet by mouth every 6 hours as needed for Pain 12/22/17  Yes Florinda Marker, PA       No Known Allergies    ROS:  As noted in HPI above    Physical Exam:  BP 106/69 (Site: Right Upper Arm, Position: Sitting, Cuff Size: Medium Adult)    Pulse 102    Temp 98.1 ??F (36.7 ??C) (Oral)    Resp 18    Ht 5\' 6"  (1.676 m)    Wt 158 lb (71.7 kg)    SpO2 98%    BMI 25.50 kg/m??     Chest: Breath sounds were clear and equal with no rales, wheezes, or rhonchi.  Respiratory effort was normal with no retractions or use of accessory muscles.    Cardiovascular: Heart sounds were normal with a regular rate and rhythm.  There were no murmurs or gallops.    Abdomen:  Bowel sounds were normal.  The abdomen was soft and non distended.  There was mild right upper quadrant and moderate lower abdominal tenderness with no guarding, rebound, or rigidity.  There was no masses, hepatosplenomegaly, or hernias.    Genitourinary: No inguinal hernias were noted on coughing and straining.    Rectal: there was some swelling in  the anal canal but no blood and stool was hemoccult negative    PATHOLOGY FROM 03/16/2018 COLONOSCOPY BIOPSIES:  ?? Mid proximal transverse colon, biopsy - Colonic mucosa showing no significant pathologic alteration.  ?? Sigmoid colon, biopsy - Colonic mucosa showing rare lymphoid follicles, no significant pathologic alteration.  ?? 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 Abdominal and Ano rectal Pain and Rectal Bleeding - I did not see any bleeding or abnormalities to account for the bleed on colonoscopy on 03/16/2018.  I suspect he is having proctitis.  I recommended that he start Sitz bath or warm soak in tube for 30 minute followed by application of Anusol HC cream to anal area as directed, three times a day and after each BM.  I recommended that he use more cream each time.  One tube should last about  1-2 weeks.  Also, he could have Irritable Bowel Syndrome so I gave him instructions on symptomatic treatment for this as well.  Follow up with me in 6 weeks.  2. Non-specific Colitis - the transverse and sigmoid colon biopsies showed no pathological changes so were probably due to irritation from the prep.  There was no rectal polyp but rather I took biopsies due to some non-specific proctocolitis so I am no sure what to make of the diagnosis of submucosal leiomyoma but in either event, it needs no further treatment or follow up.     3. Migraine Headaches  4. Attention Deficit Hyperactivity Disorder  5. Anxiety  6. Tobacco Abuse    Electronically by Caren Hazy, MD  on 04/23/2018 at 1:33 PM

## 2018-05-05 NOTE — Telephone Encounter (Signed)
MA called and LVM to inform pt that due to covid 19 we will have to move his appt to 06/17/17 with Dr. Eligah East, if pt is feeling better and not having any issues, Dr. Eligah East stated that he can cancel his appt and follow up PRN if he would like. MA awaiting return call from pt.  Electronically signed by Barbaraann Faster on 05/05/18 at 10:15 AM EDT

## 2018-07-30 ENCOUNTER — Ambulatory Visit
Admit: 2018-07-30 | Discharge: 2018-07-30 | Payer: PRIVATE HEALTH INSURANCE | Attending: Surgery | Primary: Physician Assistant

## 2018-07-30 DIAGNOSIS — K921 Melena: Secondary | ICD-10-CM

## 2018-07-30 NOTE — Patient Instructions (Addendum)
Dr. Loyal Buba recommended upper GI endoscopy with possible biopsy or polypectomy and he explained the risk, benefits, expected outcome, and alternatives to the procedure.  Risks included but are not limited to bleeding, infection, respiratory distress, hypotension, and perforation of the esophagus, stomach or intestine.  You understood and were in agreement.  You will need to have someone bring you to the hospital and take you home because you will not be able to drive or work the rest of that day.  Also, you need to have someone stay with you the rest of the day to make sure you do not develop any complications.    Over the counter antacids maximal dose as directed by mouth four times a day (1 hour after meals and right before bedtime, do not take within 1 hour of other medications) as needed.   Dr. Loyal Buba recommended not using TUMs as it is calcium based and can worsen your constipation.  He recommended Mylanta, Maalox, Gavison, etc. that does not contain calcium.    Irritable Bowel Syndrome     Irritable bowel syndrome (IBS) is a chronic disorder of the intestines. IBS does not cause inflammation and does not lead to a more serious condition.     There is no cure for IBS. Treatment focuses on managing the symptoms. Common treatment approaches include:    ?? Diet     ?? Stress management     ?? Medication     What You Will Need     Along with your medications, you will need the following:    ?? Food diary     ?? Heating pad     Steps to Take     Home Care     ?? Your doctor may ask you to keep a food diary. Record everything you eat and drink and any symptoms. This can help identify food allergies or intolerances, as well as help plan your treatment.      ?? To relieve discomfort, apply a heating pad to your abdomen.      Diet     Diet is a crucial part of treating IBS. Understanding which foods cause your symptoms and avoiding them is important. Taking these measures may help control your symptoms:    ?? Make gradual changes  to your diet. Record the results.     ?? Avoid foods and drinks that may cause symptoms, such as:     ?? High fat foods, spicy foods     ?? Dairy products     ?? Onions, cabbage, and other gas-producing food     ?? Large amounts of alcohol or caffeine     ?? Eat foods that may reduce the chance of spasm, such as:     ?? Fruits and vegetables     ?? Whole grains and other high-fiber foods (More fiber may increase gas and bloating until your body adjusts.)     ?? Eat smaller meals more often or smaller portions.     ?? Eat slowly and try not to swallow air.     ?? Drink plenty of water. This will help to reduce constipation .     Physical Activity     Include a regular exercise routine into your schedule. Exercise can improve bowel function and other IBS symptoms. Talk to your doctor first to make sure you are healthy enough to exercise.     Medications    You can try the following over the counter medications:    ??   ??  Antiflatulants (eg, simethicone)     ?? High-fiber bulking agents (eg, psyllium [Metamucil])     ?? Antidiarrheal agents (such as lomotile or immodium)     ?? Acetaminophen (eg, Tylenol)     ?? Herbs and supplements (eg, probiotics , peppermint oil ) I     Lifestyle Changes    Some things to keep in mind include:    ?? Learn as much as you can about IBS and ways to reduce your symptoms.     ?? Consider talking to a counselor or joining a support group for people with IBS.     ?? Stress can trigger IBS. Learn ways to reduce stress . Talk to your doctor about relaxation techniques and biofeedback.     ?? Get enough sleep each night.     Prevention     The cause of IBS is unknown. So, there are no known ways to prevent it. Diet and lifestyle changes may help to manage the symptoms.    Patient Information and Instructions for  Upper GI Endoscopy or Esophagogastroduodenoscopy [EGD])         Definition Upper GI Endoscopy or Esophagogastroduodenoscopy [EGD])  This is a test that uses a fiberoptic scope to examine the esophagus  (throat), stomach, and upper part of the small intestines.     Upper GI endoscopy may be recommended if you have:   Abdominal pain   Severe heartburn   Persistent nausea and vomiting   Difficulty swallowing   Blood in stool or vomit   Abnormal x-ray or other examinations of the gastrointestinal tract     Conditions that can be diagnosed with upper GI endoscopy include:   Ulcers   Tumors   Polyps   Abnormal narrowing   Inflammation     Possible Complications   Complications are rare, but no procedure is completely free of risk. If you are planning to have upper GI endoscopy, your doctor will review a list of possible complications, which may include:   Bleeding   Damage to the esophagus, stomach, or intestine   Infection   Respiratory depression (reduced breathing rate and/or depth)   Reaction to sedatives or anesthesia causing your blood pressure to drop    Some factors that may increase the risk of complications include:   Age: 5060 or older   Pregnancy   Obesity   Smoking , alcoholism , or drug use   Malnutrition   Recent illness   Diabetes   Heart or lung problems   Bleeding disorders   Use of certain medicines     Be sure to discuss these risks with your doctor before the test.     What to Expect   Prior to test   Leading up to the test:   Arrange for a ride home after the test. Also, arrange for help at home.   The night before, eat a light meal.  Do not eat or drink anything after midnight the night before the test.   Talk to your doctor about your medicines. You may be asked to stop taking some medicines up to one week before the procedure, like:   Anti-inflammatory drugs (e.g., aspirin )   Blood thinners, like clopidogrel (Plavix) or warfarin (Coumadin)     Description of the Test   You will be asked to lie on your left side. You will have monitors tracking your breathing, heart rate, and blood oxygen levels.  You will be given supplemental oxygen to breathe through  your nose.  A mouthpiece will be positioned  to help keep your mouth open.  Your throat may be sprayed with a numbing medicine. You will be given a sedative through an IV to help you relax during the test.  During the test, a small suction tube will be used to clear saliva and fluids from your mouth. The endoscope will be lubricated and placed in your mouth. You will be asked to try to swallow it. Then, it will be carefully and slowly advanced down your throat. It will be passed through your esophagus and into your stomach and intestine.  While the endoscope is being advanced, your doctor will view the images on the screen. Air will be passed through the endoscope into your digestive tract. This will be done to smooth the normal folds in the tissues, allowing your doctor to view the tissue more easily. Tiny tools may be passed through the endoscope in order to take biopsies or do other tests.     After Test   After the test, you will be observed for an hour. Then, you will be allowed to go home.   When you return home after the test, do the following to help ensure a smooth recovery:   Rest when you get home.   Ask your doctor if you can resume your normal diet. In most cases, you will be able to.   Sedatives can slow your reaction time. Do not drive or use machinery for the rest of the day.   Avoid alcohol for the rest of the day.   Be sure to follow your doctor's instructions .     How Long Will It Take?   Usually about 10-15 minutes     Will It Hurt?   Most people do not feel anything during the test and will not remember the test.  After the test, your throat may be sore and you may feel bloated.     Results   This test gives your doctor information about the health of your digestive system. The results can help to explain your symptoms. You and your doctor will talk about the results and your treatment plan.     Call Your Doctor   After the test, call your doctor if any of the following occurs:   Signs of infection, including fever and chills   Severe  abdominal pain   Hard, swollen abdomen   Difficulty swallowing or breathing   Any change or increase in your original symptoms   Bloody or black tarry colored stools   Nausea and/or vomiting   Cough, shortness of breath, or chest pain   Bleeding     In case of emergency, call 911.     Any questions or concerns, please contact my medical assistant Caitlin at [phone].
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
## 2018-08-10 NOTE — Telephone Encounter (Signed)
MA completed prior auth online at [facility]. PENDING prior authorization for [id number]) with Dr. Eligah East at [facility] in [place], [place]. Reference number [id number]. MA scanned authorization form in media tab.    Electronically signed by Mortimer Fries on 08/10/18 at 10:33 AM EDT

## 2018-08-11 ENCOUNTER — Encounter

## 2018-08-11 MED ORDER — IBUPROFEN 800 MG PO TABS
800 MG | ORAL_TABLET | Freq: Four times a day (QID) | ORAL | 2 refills | Status: DC | PRN
Start: 2018-08-11 — End: 2018-11-13

## 2018-08-11 NOTE — Telephone Encounter (Signed)
Last Appointment:  12/22/2017  No future appointments.

## 2018-08-13 NOTE — Progress Notes (Signed)
PATIENT AGREES TO HAVE COVID TESTING DONE 08/17/2018 @ [facility]. PATIENT AGREES TO SELF QUARANTINE UNTIL AFTER PROCEDURE.

## 2018-08-17 ENCOUNTER — Inpatient Hospital Stay: Payer: PRIVATE HEALTH INSURANCE | Primary: Physician Assistant

## 2018-08-17 DIAGNOSIS — U071 COVID-19: Secondary | ICD-10-CM

## 2018-08-17 NOTE — Telephone Encounter (Signed)
From: Karen Kays  To: Caren Hazy, MD  Sent: 08/13/2018 5:24 PM EDT  Subject: Visit Follow-Up Question    Hello. So I had gotten a call this morning about me going and having the covid test done on monday and then to self quarantine til the day of surgery. If its possible I am in serious need of a note stating that I will have to self quarantine. Because I will be out of work for a week and I'm planning to try to work from home so I dont lose my job or even money to feed my household. It would be much appreciated if I could get it ASAP. Please and thank you. Please call me with any questions [phone]. If I dont answer please leave a message and I will return the call.

## 2018-08-17 NOTE — Telephone Encounter (Signed)
Forwarding to Dr. Eligah East.  Electronically signed by Mortimer Fries on 08/17/18 at 9:32 AM EDT

## 2018-08-17 NOTE — Telephone Encounter (Signed)
MyChart Note    I entered a [employer] Engineer, [employer] into the system today.  My MA called me that you were at our office requesting this letter so she can print it out and give it today you today.  Thanks!!    Electronically signed by Caren Hazy, MD on 08/17/18 at 10:43 AM EDT      ===View-only below this line===      ----- Message -----       From:Todd Reyes       Sent:08/13/2018  5:24 PM EDT         UM:IEDBVTF [name], MD    Subject:Visit Follow-Up Question    Hello. So I had gotten a call this morning about me going and having the covid test done on monday and then to self quarantine til the day of surgery. If its possible I am in serious need of a note stating that I will have to self quarantine. Because I will be out of work for a week and I'm planning to try to work from home so I dont lose my job or even money to feed my household. It would be much appreciated if I could get it ASAP. Please and thank you. Please call me with any questions [phone]. If I dont answer please leave a message and I will return the call.

## 2018-08-18 NOTE — Progress Notes (Signed)
[facility] PRE-ADMISSION TESTING ENDOSCOPY INSTRUCTIONS- PAT-phone number:[phone]    ENDOSCOPY INSTRUCTIONS:   []  Bowel prep instructions reviewed.   [x]  Nothing by mouth after midnight, including gum, candy, mints, or water. Please follow your surgeons instructions if you are required to complete a bowel prep. Colonoscopy- no solid food-only clear liquids the day prior).  [x]  You may brush your teeth, gargle, but do NOT swallow water.   [x]  Do not wear makeup, lotions, powders, deodorant.  Nail polish as directed by the nurse.  []  Arrange transportation with a responsible adult companion to and from the hospital. If you do not have a responsible adult companion to transport you, you will need to make arrangements with a medical transportation company (i.e. [employer]. A Uber/taxi/bus is not appropriate unless you are accompanied by a responsible adult companion). Arrange for someone to be with you for the remainder of the day and for 24 hours after your procedure due to having had anesthesia.    Who will be your companion for transportation?__________________   Who will be staying with you for 24 hrs after your procedure?__________________    PARKING INSTRUCTIONS:   []  Arrival Time:________________________  ?? []  Parking lot  "I" OR 1 is located on [address] (the corner of [address] and [address]).  To enter, press the button and the gate will lift.  A free token will be provided to exit the lot.   One car per patient is allowed to park in this lot. All other cars are to park on [address] either in the parking garage or the handicap lot.          []  To reach the [facility] lobby from [place], upon entering the hospital, take elevator B to the 3rd floor.    EDUCATION INSTRUCTIONS:  [x]  Bring a complete list of your medications, please write the last time you took the medicine, give this list to the nurse.  [x]  Take the following medications the morning of surgery with 1-2  ounces of water: SEE MED SHEET  []  Stop herbal supplements and vitamins 5 days before your surgery.  []  DO NOT take any diabetic medicine the morning of surgery.  Follow instructions for insulin the day before surgery.  []  If you are diabetic and your blood sugar is low or you feel symptomatic, you may drink 1-2 ounces of apple juice or take a glucose tablet.  The morning of your procedure, you may call the pre-op area if you have concerns about your blood sugar [phone].  []  Use your inhalers the morning of surgery.  Bring your emergency inhaler with you day of surgery.  []  Follow physician instructions regarding any blood thinners you may be taking.    WHAT TO EXPECT:  [x]  The day of your procedure you will be greeted and checked in by the [employer].  In addition, you will be registered in the [place] by a Patient Barista. Please bring your photo ID and insurance card. A nurse will greet you in accordance to the time you are needed in the pre-op area to prepare you for surgery. Please do not be discouraged if you are not greeted in the order you arrive as there are many variables that are involved in patient preparation.  Your patience is greatly appreciated as you wait for your nurse.  Please bring in items such as: books, magazines, newspapers, electronics, or any other items  to occupy your time in the waiting  area.   []   Delays may occur. Staff will make a sincere effort to keep you informed of delays.  If any delays occur with your procedure, we apologize ahead of time for your inconvenience as we recognize the value of your time.

## 2018-08-20 LAB — COVID-19 AMBULATORY: SARS-CoV-2: NOT DETECTED

## 2018-08-21 ENCOUNTER — Inpatient Hospital Stay: Payer: PRIVATE HEALTH INSURANCE

## 2018-08-21 ENCOUNTER — Telehealth

## 2018-08-21 MED ORDER — MIDAZOLAM HCL 2 MG/2ML IJ SOLN
2 | INTRAMUSCULAR | Status: AC
Start: 2018-08-21 — End: 2018-08-21

## 2018-08-21 MED ORDER — MIDAZOLAM HCL 2 MG/2ML IJ SOLN
2 MG/ML | INTRAMUSCULAR | Status: DC | PRN
Start: 2018-08-21 — End: 2018-08-21
  Administered 2018-08-21: 11:00:00 2 via INTRAVENOUS

## 2018-08-21 MED ORDER — NORMAL SALINE FLUSH 0.9 % IV SOLN
0.9 % | Freq: Two times a day (BID) | INTRAVENOUS | Status: DC
Start: 2018-08-21 — End: 2018-08-21

## 2018-08-21 MED ORDER — PROPOFOL 500 MG/50ML IV EMUL
50050 | INTRAVENOUS | Status: DC | PRN
Start: 2018-08-21 — End: 2018-08-21
  Administered 2018-08-21: 11:00:00 200 via INTRAVENOUS

## 2018-08-21 MED ORDER — NORMAL SALINE FLUSH 0.9 % IV SOLN
0.9 % | INTRAVENOUS | Status: DC | PRN
Start: 2018-08-21 — End: 2018-08-21

## 2018-08-21 MED ORDER — OMEPRAZOLE 20 MG PO CPDR
20 MG | ORAL_CAPSULE | Freq: Two times a day (BID) | ORAL | 2 refills | Status: DC
Start: 2018-08-21 — End: 2019-12-13

## 2018-08-21 MED ORDER — ALUM & MAG HYDROXIDE-SIMETH 400-400-40 MG/5ML PO SUSP
400-400-40 MG/5ML | Freq: Four times a day (QID) | ORAL | 3 refills | Status: DC
Start: 2018-08-21 — End: 2019-11-09

## 2018-08-21 MED ORDER — PROPOFOL 500 MG/50ML IV EMUL
500 | INTRAVENOUS | Status: AC
Start: 2018-08-21 — End: 2018-08-21

## 2018-08-21 MED ORDER — LACTATED RINGERS IV SOLN
INTRAVENOUS | Status: DC
Start: 2018-08-21 — End: 2018-08-21
  Administered 2018-08-21: 11:00:00 via INTRAVENOUS

## 2018-08-21 MED ORDER — FAMOTIDINE 20 MG PO TABS
20 MG | ORAL_TABLET | Freq: Two times a day (BID) | ORAL | 11 refills | Status: DC
Start: 2018-08-21 — End: 2018-08-21

## 2018-08-21 MED FILL — MIDAZOLAM HCL 2 MG/2ML IJ SOLN: 2 MG/ML | INTRAMUSCULAR | Qty: 2

## 2018-08-21 MED FILL — PROPOFOL 500 MG/50ML IV EMUL: 500 MG/50ML | INTRAVENOUS | Qty: 50

## 2018-08-21 NOTE — Interval H&P Note (Signed)
Update History & Physical    The patient's History and Physical of July 30, 2018 was reviewed with the patient and I examined the patient. There was no change. The surgical site was confirmed by the patient and me.     Plan: The risks, benefits, expected outcome, and alternative to the recommended procedure have been discussed with the patient. Patient understands and wants to proceed with the procedure.     Electronically signed by Caren Hazy, MD on 08/21/2018 at 7:14 AM

## 2018-08-21 NOTE — Discharge Instructions (Signed)
Findings of upper GI endoscopy:  1. Heartburn, indigestion, and intermittent tarry stools-most likely secondary to gastritis and duodenitis  2. Gastritis and duodenitis-Pepcid 20 mg by mouth twice a day (before breakfast and one hour before bedtime) x 6 weeks then decrease to once a day at bedtime x 6 weeks then as needed .  DO NOT take within one hour of antacids.  Over the counter antacids maximal dose as directed by mouth four times a day (1 hour after meals and right before bedtime, do not take within 1 hour of other medications) x 6 weeks then as needed. Heartburn, reflux, and indigestion precautions (see below).  Dr. Loyal Buba will contact you with biopsy results and further recommendations.  Call [phone] if you do not hear from him in 2 weeks.   3. Small slight hiatal hernia-treatment as above    Heartburn, Acid Reflux, and Indigestion Diet and Activity Restrictions     Avoid drinking too much alcohol: Alcohol is found in beer, wine, liquor such as vodka and whiskey, or other adult drinks. Talk to your caregiver if you drink alcohol.      Avoid eating large meals frequently: Eating a lot of food at one time increases the amount of acid needed to digest it. Eat six small meals each day instead of three large ones, and eat slowly. Avoid eating for 2 to 3 hours before bedtime as this may also decrease acid reflux.     Avoid foods and drinks that may increase heartburn: These include chocolate, peppermint, fried or fatty foods, and caffeine-containing beverages. Caffeine may be found in some coffee, tea, and soda. Foods and beverages that can irritate your esophagus, such as citrus fruits and juices, should also be avoided.     Elevate the head of the bed: Place 6-inch blocks under the head of your bed frame. Use one or two pillows under your head and shoulders when sleeping.     Keep a healthy weight: Talk to your caregiver about your weight to learn if you weigh too little or too much. If you are  overweight, weight loss helps relieve symptoms of GERD.     Stop smoking: Cigarette and tobacco smoking weakens the lower esophageal sphincter. Ask your caregiver for help to stop smoking!

## 2018-08-21 NOTE — Progress Notes (Signed)
Patient admitted to secondary recovery, patient vitals stable, will continue to monitor. Tolerating PO.     Discharge instructions reviewed with patient. States understanding.
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
## 2018-08-21 NOTE — Progress Notes (Signed)
Covid testing done  Results negative  Self quarantine guidelines have been maintained  No unusual signs or symptoms to report  Screening questionnaire completed

## 2018-08-21 NOTE — Telephone Encounter (Signed)
I performed an EGD on this patient this morning and he had gastritis and duodenitis.  I prescribed him Pepcid 20 mg po BID but the pharmacist subsequently called me stating that Pepcid is on back order and suggested changing the prescription to Omeprazole.  So, I discontinued the Pepcid and e-prescribed Omeprazole 20 mg po BID.  I tried calling the patient but he did not answer.  I spoke with the Pharmacist at [facility] and let them know the above and to instruct the patient to take the Omeprazole same as directed to take the Pepcid.      Electronically signed by Caren Hazy, MD on 08/21/18 at 10:54 AM EDT

## 2018-08-21 NOTE — Anesthesia Post-Procedure Evaluation (Signed)
Department of Anesthesiology  Postprocedure Note    Patient: Todd Reyes  MRN: [id number]  Birthdate: October 21, 1990  Date of evaluation: 08/21/2018  Time:  8:06 AM     Procedure Summary     Date:  08/21/18 Room / Location:  [facility] / [facility]    Anesthesia Start:  [id number] Anesthesia Stop:  0751    Procedure:  EGD BIOPSY (N/A ) Diagnosis:  (HEARTBURN, TARRY STOOLS)    Surgeon:  Zannie Cove, MD Responsible Provider:  Coralyn Pear, MD    Anesthesia Type:  MAC ASA Status:  2          Anesthesia Type: MAC    Aldrete Phase I: Aldrete Score: 9    Aldrete Phase II: Aldrete Score: 10    Last vitals: Reviewed and per EMR flowsheets.       Anesthesia Post Evaluation    Patient location during evaluation: PACU  Patient participation: complete - patient participated  Level of consciousness: awake and alert  Pain score: 0  Airway patency: patent  Nausea & Vomiting: no vomiting and no nausea  Complications: no  Cardiovascular status: hemodynamically stable  Respiratory status: spontaneous ventilation  Hydration status: stable

## 2018-08-21 NOTE — Op Note (Signed)
PROCEDURE NOTE    DATE OF PROCEDURE: 08/21/2018     SURGEON: Zannie Cove, M.D.    ASSISTANT: None    PREOPERATIVE DIAGNOSIS: Heartburn and indigestion with intermittent tarry stools    POSTOPERATIVE DIAGNOSIS: Same with moderate gastritis, mild to moderate duodenitis, small hiatal hernia    OPERATION: Upper GI endoscopy with antral gastric biopsy for histology to rule out Helicobacter pylori     ANESTHESIA: Local monitored anesthesia.     ESTIMATED BLOOD LOSS: Less than 50 ml    COMPLICATIONS: None.     SPECIMENS:  Was Obtained: Gastric biopsy for Helicobacter pylori    HISTORY: The patient is a 28 y.o. year old male with history of above preop diagnosis.  I recommended esophagogastroduodenoscopy with possible biopsy and I explained the risk, benefits, expected outcome, and alternatives to the procedure.  Risks included but are not limited to bleeding, infection, respiratory distress, hypotension, and perforation of the esophagus, stomach, or duodenum.  Patient understands and is in agreement.    PROCEDURE: The patient was given IV conscious sedation per anesthesia. The patient was given supplemental oxygen by nasal cannula.  The gastroscope was inserted orally and advanced under direct vision through the esophagus, through the stomach, through the pylorus, and into the descending duodenum.      Findings:  Duodenum:     Descending: abnormal: Mild duodenitis    Bulb: abnormal: Mild duodenitis    Stomach:    Antrum: abnormal: Moderate gastritis; with antral gastric biopsy for histology to rule out Helicobacter pylori     Body: abnormal: Moderate gastritis    Fundus: abnormal: Moderate gastritis with small sliding hiatal hernia    Esophagus: abnormal: Small slight hiatal hernia with normal esophagus    Larynx: normal    The scope was removed and the patient tolerated the procedure well.     IMPRESSION/PLAN:   1. Heartburn, indigestion, and intermittent tarry stools-most likely secondary to gastritis and  duodenitis  2. Gastritis and duodenitis-Pepcid 20 mg by mouth twice a day (before breakfast and one hour before bedtime) x 6 weeks then decrease to once a day at bedtime x 6 weeks then as needed .  DO NOT take within one hour of antacids.  Over the counter antacids maximal dose as directed by mouth four times a day (1 hour after meals and right before bedtime, do not take within 1 hour of other medications) x 6 weeks then as needed. Heartburn, reflux, and indigestion precautions given regarding diet and activity restrictions.  We will check biopsies and if positive Helicobacter pylori we will treat with 2-week course of 2 different antibiotics.  Follow-up with me as needed  3. Small slight hiatal hernia-treatment as above    Electronically signed by Zannie Cove, MD  on 08/21/2018 at 7:40 AM

## 2018-08-21 NOTE — Other (Signed)
MyChart Note    I reviewed the biopsy results from your upper GI endoscopy on 08/21/2018.  It did show severe inflammation with signs of Helicobacter pylori infection.  I sent a prescription for 2 different antibiotics, doxycycline and metronidazole, to your pharmacy for 2-week course of each of the antibiotics.  Please take this in addition to the other medications I instructed you on after the procedure.  If your symptoms do not resolve within the next 6 weeks with the antibiotics and the other treatments I instructed you on then they can plan to follow-up with me in the office.    Please contact my office if you have any questions.    Electronically signed by Caren Hazy, MD on 08/25/18 at 12:42 PM EDT

## 2018-08-25 ENCOUNTER — Encounter

## 2018-08-25 MED ORDER — METRONIDAZOLE 500 MG PO TABS
500 MG | ORAL_TABLET | Freq: Four times a day (QID) | ORAL | 0 refills | Status: AC
Start: 2018-08-25 — End: 2018-09-08

## 2018-08-25 MED ORDER — DOXYCYCLINE HYCLATE 100 MG PO CAPS
100 MG | ORAL_CAPSULE | Freq: Two times a day (BID) | ORAL | 0 refills | Status: AC
Start: 2018-08-25 — End: 2018-09-08

## 2018-11-12 ENCOUNTER — Encounter

## 2018-11-12 NOTE — Telephone Encounter (Signed)
02/09/2018  Visit date not found

## 2018-11-13 ENCOUNTER — Encounter

## 2018-11-13 MED ORDER — IBUPROFEN 800 MG PO TABS
800 MG | ORAL_TABLET | Freq: Four times a day (QID) | ORAL | 0 refills | Status: AC | PRN
Start: 2018-11-13 — End: ?

## 2018-11-13 NOTE — Telephone Encounter (Signed)
Needs apt. It has almost been 1 yr

## 2019-03-24 ENCOUNTER — Encounter

## 2019-03-24 NOTE — Telephone Encounter (Signed)
02/09/2018  Visit date not found

## 2019-11-08 NOTE — Telephone Encounter (Signed)
Reason for Disposition  ??? Abdominal pain is a chronic symptom (recurrent or ongoing AND lasting > 4 weeks)    Answer Assessment - Initial Assessment Questions  1. LOCATION: "Where does it hurt?"       Lower abdomen- almost in the pelvic area    2. RADIATION: "Does the pain shoot anywhere else?" (e.g., chest, back)     Has back pain- possibly unrelated per patient    3. ONSET: "When did the pain begin?" (Minutes, hours or days ago)       End of 2019, beginning of 2020    4. SUDDEN: "Gradual or sudden onset?"      Gradual    5. PATTERN "Does the pain come and go, or is it constant?"     - If constant: "Is it getting better, staying the same, or worsening?"       (Note: Constant means the pain never goes away completely; most serious pain is constant and it progresses)      - If intermittent: "How long does it last?" "Do you have pain now?"      (Note: Intermittent means the pain goes away completely between bouts)      Intermittent    6. SEVERITY: "How bad is the pain?"  (e.g., Scale 1-10; mild, moderate, or severe)     - MILD (1-3): doesn't interfere with normal activities, abdomen soft and not tender to touch      - MODERATE (4-7): interferes with normal activities or awakens from sleep, tender to touch      - SEVERE (8-10): excruciating pain, doubled over, unable to do any normal activities       Can be anywhere from a 7-10/10, pain comes and goes.    7. RECURRENT SYMPTOM: "Have you ever had this type of abdominal pain before?" If so, ask: "When was the last time?" and "What happened that time?"       Symptoms have been here since 2019    8. CAUSE: "What do you think is causing the abdominal pain?"      Has had two colonoscopy's- unsure    9. RELIEVING/AGGRAVATING FACTORS: "What makes it better or worse?" (e.g., movement, antacids, bowel movement)     N/A    10. OTHER SYMPTOMS: "Has there been any vomiting, diarrhea, constipation, or urine problems?"        Painful with a bowel movement, blood when wiping, weak  stream when urinating.  Reports lightheadedness and dizziness.  Discharge from belly button.    Protocols used: ABDOMINAL PAIN - MALE-ADULT-OH    Received call from April at [facility] with Red Flag Complaint.    Brief description of triage: See above.    Triage indicates for patient to be seen in the next two weeks.    Care advice provided, patient verbalizes understanding; denies any other questions or concerns; instructed to call back for any new or worsening symptoms.    Message sent to [facility] for appointment scheduling.    Attention Provider:  Thank you for allowing me to participate in the care of your patient.  The patient was connected to triage in response to information provided to the E[facility].  Please do not respond through this encounter as the response is not directed to a shared pool.

## 2019-11-09 ENCOUNTER — Ambulatory Visit
Admit: 2019-11-09 | Discharge: 2019-11-09 | Payer: PRIVATE HEALTH INSURANCE | Attending: Physician Assistant | Primary: Physician Assistant

## 2019-11-09 DIAGNOSIS — K6289 Other specified diseases of anus and rectum: Secondary | ICD-10-CM

## 2019-11-09 LAB — POCT URINALYSIS DIPSTICK
Bilirubin, UA: NEGATIVE
Blood, UA POC: NEGATIVE
Glucose, UA POC: NEGATIVE
Ketones, UA: NEGATIVE
Leukocytes, UA: NEGATIVE
Nitrite, UA: NEGATIVE
Protein, UA POC: NEGATIVE
Spec Grav, UA: 1.025
Urobilinogen, UA: 0.2
pH, UA: 7

## 2019-11-09 MED ORDER — HYDROCORTISONE (PERIANAL) 2.5 % EX CREA
2.5 | Freq: Two times a day (BID) | CUTANEOUS | 0 refills | Status: DC
Start: 2019-11-09 — End: 2019-12-13

## 2019-11-09 MED ORDER — PREDNISONE 10 MG PO TABS
10 MG | ORAL_TABLET | Freq: Two times a day (BID) | ORAL | 0 refills | Status: AC
Start: 2019-11-09 — End: 2019-11-14

## 2019-12-07 ENCOUNTER — Encounter: Payer: PRIVATE HEALTH INSURANCE | Attending: Physician Assistant | Primary: Physician Assistant

## 2019-12-13 ENCOUNTER — Ambulatory Visit
Admit: 2019-12-13 | Discharge: 2019-12-13 | Payer: PRIVATE HEALTH INSURANCE | Attending: Physician Assistant | Primary: Physician Assistant

## 2019-12-13 DIAGNOSIS — M519 Unspecified thoracic, thoracolumbar and lumbosacral intervertebral disc disorder: Secondary | ICD-10-CM

## 2019-12-13 MED ORDER — DEXAMETHASONE SODIUM PHOSPHATE 4 MG/ML IJ SOLN
4 MG/ML | Freq: Once | INTRAMUSCULAR | Status: AC
Start: 2019-12-13 — End: 2019-12-13
  Administered 2019-12-13: 18:00:00 4 mg via INTRAMUSCULAR

## 2019-12-13 MED ORDER — CYCLOBENZAPRINE HCL 5 MG PO TABS
5 MG | ORAL_TABLET | Freq: Two times a day (BID) | ORAL | 0 refills | Status: AC | PRN
Start: 2019-12-13 — End: 2019-12-23

## 2020-01-28 ENCOUNTER — Ambulatory Visit
Admit: 2020-01-28 | Discharge: 2020-01-28 | Payer: PRIVATE HEALTH INSURANCE | Attending: Physician Assistant | Primary: Physician Assistant

## 2020-01-28 DIAGNOSIS — M519 Unspecified thoracic, thoracolumbar and lumbosacral intervertebral disc disorder: Secondary | ICD-10-CM

## 2020-01-28 MED ORDER — CYCLOBENZAPRINE HCL 5 MG PO TABS
5 MG | ORAL_TABLET | Freq: Two times a day (BID) | ORAL | 0 refills | Status: AC | PRN
Start: 2020-01-28 — End: 2020-02-07

## 2020-01-28 MED ORDER — METHYLPREDNISOLONE 4 MG PO TBPK
4 MG | PACK | ORAL | 0 refills | Status: AC
Start: 2020-01-28 — End: 2020-02-03
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
## 2020-02-08 ENCOUNTER — Encounter: Attending: Physician Assistant | Primary: Physician Assistant

## 2020-03-10 ENCOUNTER — Encounter: Attending: Physician Assistant | Primary: Physician Assistant
